# Patient Record
Sex: Male | Born: 1955 | Race: White | Hispanic: No | Marital: Married | Smoking: Former smoker
Health system: Southern US, Community
[De-identification: ages and names within clinical notes are randomized; demographics above are authoritative.]

## PROBLEM LIST (undated history)

## (undated) DIAGNOSIS — E7800 Pure hypercholesterolemia, unspecified: Secondary | ICD-10-CM

## (undated) DIAGNOSIS — F419 Anxiety disorder, unspecified: Secondary | ICD-10-CM

---

## 2002-07-02 ENCOUNTER — Ambulatory Visit (HOSPITAL_COMMUNITY): Admission: RE | Admit: 2002-07-02 | Discharge: 2002-07-02 | Payer: Self-pay | Admitting: Internal Medicine

## 2004-09-02 ENCOUNTER — Ambulatory Visit (HOSPITAL_COMMUNITY): Admission: RE | Admit: 2004-09-02 | Discharge: 2004-09-02 | Payer: Self-pay | Admitting: Gastroenterology

## 2010-07-06 ENCOUNTER — Ambulatory Visit
Admission: RE | Admit: 2010-07-06 | Discharge: 2010-07-06 | Disposition: A | Discharge status: Home / Self Care | Payer: BC Managed Care – PPO | Source: Ambulatory Visit | Attending: Internal Medicine | Admitting: Internal Medicine

## 2010-07-06 ENCOUNTER — Other Ambulatory Visit: Payer: Self-pay | Admitting: Internal Medicine

## 2010-07-06 DIAGNOSIS — R05 Cough: Secondary | ICD-10-CM

## 2014-07-29 ENCOUNTER — Other Ambulatory Visit: Payer: Self-pay | Admitting: Internal Medicine

## 2014-07-29 ENCOUNTER — Ambulatory Visit
Admission: RE | Admit: 2014-07-29 | Discharge: 2014-07-29 | Disposition: A | Discharge status: Home / Self Care | Payer: Self-pay | Source: Ambulatory Visit | Attending: Internal Medicine | Admitting: Internal Medicine

## 2014-07-29 DIAGNOSIS — R059 Cough, unspecified: Secondary | ICD-10-CM

## 2014-07-29 DIAGNOSIS — R05 Cough: Secondary | ICD-10-CM

## 2017-11-14 ENCOUNTER — Other Ambulatory Visit: Payer: Self-pay | Admitting: Internal Medicine

## 2017-11-14 ENCOUNTER — Ambulatory Visit
Admission: RE | Admit: 2017-11-14 | Discharge: 2017-11-14 | Disposition: A | Discharge status: Home / Self Care | Payer: Self-pay | Source: Ambulatory Visit | Attending: Internal Medicine | Admitting: Internal Medicine

## 2017-11-14 DIAGNOSIS — M25551 Pain in right hip: Secondary | ICD-10-CM

## 2017-11-14 DIAGNOSIS — M545 Low back pain: Secondary | ICD-10-CM

## 2020-02-04 ENCOUNTER — Other Ambulatory Visit: Payer: Self-pay | Admitting: *Deleted

## 2020-02-04 ENCOUNTER — Other Ambulatory Visit: Payer: Self-pay

## 2020-02-04 DIAGNOSIS — Z20822 Contact with and (suspected) exposure to covid-19: Secondary | ICD-10-CM

## 2020-02-06 LAB — SARS-COV-2, NAA 2 DAY TAT

## 2020-02-06 LAB — NOVEL CORONAVIRUS, NAA: SARS-CoV-2, NAA: DETECTED — AB

## 2020-02-06 LAB — SPECIMEN STATUS REPORT

## 2020-04-29 ENCOUNTER — Emergency Department (HOSPITAL_COMMUNITY): Payer: Medicare Other

## 2020-04-29 ENCOUNTER — Other Ambulatory Visit: Payer: Self-pay

## 2020-04-29 ENCOUNTER — Encounter (HOSPITAL_COMMUNITY): Payer: Self-pay | Admitting: Emergency Medicine

## 2020-04-29 DIAGNOSIS — R509 Fever, unspecified: Secondary | ICD-10-CM | POA: Insufficient documentation

## 2020-04-29 DIAGNOSIS — R0789 Other chest pain: Secondary | ICD-10-CM | POA: Diagnosis not present

## 2020-04-29 DIAGNOSIS — R053 Chronic cough: Secondary | ICD-10-CM | POA: Insufficient documentation

## 2020-04-29 DIAGNOSIS — Z87891 Personal history of nicotine dependence: Secondary | ICD-10-CM | POA: Insufficient documentation

## 2020-04-29 DIAGNOSIS — Z20822 Contact with and (suspected) exposure to covid-19: Secondary | ICD-10-CM | POA: Diagnosis not present

## 2020-04-29 DIAGNOSIS — R0602 Shortness of breath: Secondary | ICD-10-CM | POA: Insufficient documentation

## 2020-04-29 LAB — BASIC METABOLIC PANEL
Anion gap: 10 (ref 5–15)
BUN: 13 mg/dL (ref 8–23)
CO2: 23 mmol/L (ref 22–32)
Calcium: 9.2 mg/dL (ref 8.9–10.3)
Chloride: 103 mmol/L (ref 98–111)
Creatinine, Ser: 1.06 mg/dL (ref 0.61–1.24)
GFR, Estimated: >60 mL/min (ref >60)
Glucose, Bld: 112 mg/dL — ABNORMAL HIGH (ref 70–99)
Potassium: 3.6 mmol/L (ref 3.5–5.1)
Sodium: 136 mmol/L (ref 135–145)

## 2020-04-29 LAB — CBC
HCT: 48.3 % (ref 39.0–52.0)
Hemoglobin: 16.5 g/dL (ref 13.0–17.0)
MCH: 31.5 pg (ref 26.0–34.0)
MCHC: 34.2 g/dL (ref 30.0–36.0)
MCV: 92.4 fL (ref 80.0–100.0)
Platelets: 169 10*3/uL (ref 150–400)
RBC: 5.23 MIL/uL (ref 4.22–5.81)
RDW: 12.3 % (ref 11.5–15.5)
WBC: 6.2 10*3/uL (ref 4.0–10.5)
nRBC: 0 % (ref 0.0–0.2)

## 2020-04-29 LAB — RESP PANEL BY RT-PCR (FLU A&B, COVID) ARPGX2
Influenza A by PCR: NEGATIVE
Influenza B by PCR: NEGATIVE
SARS Coronavirus 2 by RT PCR: NEGATIVE

## 2020-04-29 LAB — TROPONIN I (HIGH SENSITIVITY): Troponin I (High Sensitivity): 3 ng/L (ref <18)

## 2020-04-29 MED ORDER — OXYCODONE-ACETAMINOPHEN 5-325 MG PO TABS
1.0000 | ORAL_TABLET | ORAL | Status: DC | PRN
  Administered 2020-04-29: 1 via ORAL
  Filled 2020-04-29: qty 1

## 2020-04-29 MED ORDER — ONDANSETRON 4 MG PO TBDP
4.0000 mg | ORAL_TABLET | Freq: Once | ORAL | Status: AC
  Administered 2020-04-29: 4 mg via ORAL
  Filled 2020-04-29: qty 1

## 2020-04-29 NOTE — ED Triage Notes (Signed)
Patient states he has body aches, sob, chest tightness,fever, and chills. Patient states this started last night.

## 2020-04-30 ENCOUNTER — Emergency Department (HOSPITAL_COMMUNITY)
Admission: EM | Admit: 2020-04-30 | Discharge: 2020-04-30 | Disposition: A | Discharge status: Home / Self Care | Payer: Medicare Other | Attending: Emergency Medicine | Admitting: Emergency Medicine

## 2020-04-30 DIAGNOSIS — R079 Chest pain, unspecified: Secondary | ICD-10-CM

## 2020-04-30 DIAGNOSIS — R509 Fever, unspecified: Secondary | ICD-10-CM | POA: Diagnosis not present

## 2020-04-30 HISTORY — DX: Anxiety disorder, unspecified: F41.9

## 2020-04-30 HISTORY — DX: Pure hypercholesterolemia, unspecified: E78.00

## 2020-04-30 LAB — TROPONIN I (HIGH SENSITIVITY): Troponin I (High Sensitivity): 7 ng/L (ref <18)

## 2020-04-30 NOTE — Discharge Instructions (Addendum)
Your measures of heart damage were both negative.  This makes this unlikely to be a heart attack.  Please follow-up with your family doctor.  Take tylenol 2 pills 4 times a day and motrin 4 pills 3 times a day.  Drink plenty of fluids.  Return for worsening shortness of breath, headache, confusion. Follow up with your family doctor.

## 2020-04-30 NOTE — ED Provider Notes (Signed)
[facility]-EMERGENCY DEPT Provider Note   CSN: [id number] Arrival date & time: 04/29/20  1930     History Chief Complaint  Patient presents with  . Chest Pain  . Shortness of Breath    Steve Mcpherson is a 64 y.o. male.  64 yo M with a chief complaints of fever chest pain shortness of breath.  Patient had a dental extraction and a cadaver bone grafting done yesterday.  Today he felt somewhat bad while he was at work and then came home.  Checked his temperature and was 104 temp orally.  The patient rested and then realized he was having some trouble breathing.  Felt some chest pressure with this as well.  Has had a sensation like this previously with anxiety though not completely similar.  Worse with exertion better with rest.  Having a mild cough with chronic and unchanged.  No rashes no abdominal pain no urinary symptoms.  Denies any significant pain in his mouth with procedure.  Denies pain in his neck.  Received a Percocet in triage while waiting to be seen and had complete resolution of his symptoms.  Denies history of MI, denies hypertension.  History of lipidemia denies diabetes remote smoker denies family history of MI.  Denies history of PE or DVT denies hemoptysis denies unilateral lower extremity edema denies recent surgery immobilization or hospitalization denies history of cancer.  The history is provided by the patient and the spouse.  Chest Pain Pain location:  Epigastric Pain quality: aching and pressure   Pain radiates to:  Does not radiate Pain severity:  Moderate Onset quality:  Gradual Duration:  6 hours Timing:  Rare Progression:  Resolved Chronicity:  New Relieved by:  Nothing Worsened by:  Nothing Ineffective treatments:  None tried Associated symptoms: shortness of breath   Associated symptoms: no abdominal pain, no fever, no headache, no palpitations and no vomiting   Shortness of Breath Associated symptoms: chest pain    Associated symptoms: no abdominal pain, no fever, no headaches, no rash and no vomiting        Past Medical History:  Diagnosis Date  . Anxiety   . High blood cholesterol     There are no problems to display for this patient.   History reviewed. No pertinent surgical history.     History reviewed. No pertinent family history.  Social History   Tobacco Use  . Smoking status: Former Games developer  . Smokeless tobacco: Never Used  Vaping Use  . Vaping Use: Never used  Substance Use Topics  . Alcohol use: Not Currently  . Drug use: Never    Home Medications Prior to Admission medications   Not on File    Allergies    Patient has no known allergies.  Review of Systems   Review of Systems  Constitutional: Negative for chills and fever.  HENT: Negative for congestion and facial swelling.   Eyes: Negative for discharge and visual disturbance.  Respiratory: Positive for shortness of breath.   Cardiovascular: Positive for chest pain. Negative for palpitations.  Gastrointestinal: Negative for abdominal pain, diarrhea and vomiting.  Musculoskeletal: Negative for arthralgias and myalgias.  Skin: Negative for color change and rash.  Neurological: Negative for tremors, syncope and headaches.  Psychiatric/Behavioral: Negative for confusion and dysphoric mood.    Physical Exam Updated Vital Signs BP 110/75 (BP Location: Left Arm)   Pulse 91   Temp 99.1 F (37.3 C)   Resp 16   Ht 5\' 11"  (1.803  m)   Wt 96.2 kg   SpO2 95%   BMI 29.57 kg/m   Physical Exam Vitals and nursing note reviewed.  Constitutional:      Appearance: He is well-developed and well-nourished.  HENT:     Head: Normocephalic and atraumatic.     Mouth/Throat:   Eyes:     Extraocular Movements: EOM normal.     Pupils: Pupils are equal, round, and reactive to light.  Neck:     Vascular: No JVD.  Cardiovascular:     Rate and Rhythm: Normal rate and regular rhythm.     Heart sounds: No murmur  heard. No friction rub. No gallop.   Pulmonary:     Effort: No respiratory distress.     Breath sounds: No wheezing.  Abdominal:     General: There is no distension.     Tenderness: There is no guarding or rebound.  Musculoskeletal:        General: Normal range of motion.     Cervical back: Normal range of motion and neck supple.  Skin:    Coloration: Skin is not pale.     Findings: No rash.  Neurological:     Mental Status: He is alert and oriented to person, place, and time.  Psychiatric:        Mood and Affect: Mood and affect normal.        Behavior: Behavior normal.     ED Results / Procedures / Treatments   Labs (all labs ordered are listed, but only abnormal results are displayed) Labs Reviewed  BASIC METABOLIC PANEL - Abnormal; Notable for the following components:      Result Value   Glucose, Bld 112 (*)    All other components within normal limits  RESP PANEL BY RT-PCR (FLU A&B, COVID) ARPGX2  CULTURE, BLOOD (ROUTINE X 2)  CULTURE, BLOOD (ROUTINE X 2)  CBC  URINALYSIS, ROUTINE W REFLEX MICROSCOPIC  TROPONIN I (HIGH SENSITIVITY)  TROPONIN I (HIGH SENSITIVITY)    EKG EKG Interpretation  Date/Time:  Wednesday April 29 2020 19:41:06 EST Ventricular Rate:  107 PR Interval:    QRS Duration: 104 QT Interval:  339 QTC Calculation: 453 R Axis:   -86 Text Interpretation: Sinus tachycardia Atrial premature complex Probable right ventricular hypertrophy Inferior infarct, old Baseline wander in lead(s) II 12 Lead; Mason-Likar No old tracing to compare Confirmed by Melene Plan [phone]) on 04/30/2020 1:06:51 AM   Radiology DG Chest 2 View  Result Date: 04/29/2020 CLINICAL DATA:  Chest pain, chest tightness, shortness of breath EXAM: CHEST - 2 VIEW COMPARISON:  Chest x-ray 07/29/2014., Chest x-ray 07/06/2010 FINDINGS: The heart size and mediastinal contours are within normal limits. Aortic arch calcification. Posterior basilar scarring. No focal consolidation. No  pulmonary edema. No pleural effusion. No pneumothorax. No acute osseous abnormality. IMPRESSION: No active cardiopulmonary disease. Electronically Signed   By: Tish Frederickson M.D.   On: 04/29/2020 20:11    Procedures Procedures (including critical care time)  Medications Ordered in ED Medications  oxyCODONE-acetaminophen (PERCOCET/ROXICET) 5-325 MG per tablet 1 tablet (1 tablet Oral Given 04/29/20 2037)  ondansetron (ZOFRAN-ODT) disintegrating tablet 4 mg (4 mg Oral Given 04/29/20 2037)    ED Course  I have reviewed the triage vital signs and the nursing notes.  Pertinent labs & imaging results that were available during my care of the patient were reviewed by me and considered in my medical decision making (see chart for details).    MDM Rules/Calculators/A&P  64 yo M with a chief complaints of a fever and shortness of breath.  This resolved completely with Percocet.  He had a oral surgery done yesterday.  No murmurs on my exam.  Well-appearing and nontoxic.  Clear lung sounds for me.  Chest x-ray viewed by me without focal infiltrate.  Covid test negative.  Initial troponin negative.  No significant anemia.  No significant electrolyte abnormality.  With the reported high fever at home we will send blood cultures.  His oral surgery area looks well, no erythema no fluctuance no drainage.  I discussed possible observation in the hospital which the patient is declining.  We will get a second troponin.  If negative will discharge home.  Delta negative.  Continues to feel well.  PCP follow up.   3:47 AM:  I have discussed the diagnosis/risks/treatment options with the patient and believe the pt to be eligible for discharge home to follow-up with PCP. We also discussed returning to the ED immediately if new or worsening sx occur. We discussed the sx which are most concerning (e.g., sudden worsening pain, fever, inability to tolerate by mouth) that necessitate immediate  return. Medications administered to the patient during their visit and any new prescriptions provided to the patient are listed below.  Medications given during this visit Medications  oxyCODONE-acetaminophen (PERCOCET/ROXICET) 5-325 MG per tablet 1 tablet (1 tablet Oral Given 04/29/20 2037)  ondansetron (ZOFRAN-ODT) disintegrating tablet 4 mg (4 mg Oral Given 04/29/20 2037)     The patient appears reasonably screen and/or stabilized for discharge and I doubt any other medical condition or other [facility] requiring further screening, evaluation, or treatment in the ED at this time prior to discharge.   Final Clinical Impression(s) / ED Diagnoses Final diagnoses:  Fever in adult  Nonspecific chest pain    Rx / DC Orders ED Discharge Orders    None       Melene Plan, DO 04/30/20 [phone]

## 2020-05-05 LAB — CULTURE, BLOOD (ROUTINE X 2)
Culture: NO GROWTH
Special Requests: ADEQUATE

## 2021-03-17 ENCOUNTER — Other Ambulatory Visit: Payer: Self-pay | Admitting: Internal Medicine

## 2021-03-17 ENCOUNTER — Ambulatory Visit
Admission: RE | Admit: 2021-03-17 | Discharge: 2021-03-17 | Disposition: A | Discharge status: Home / Self Care | Payer: Medicare Other | Source: Ambulatory Visit | Attending: Internal Medicine | Admitting: Internal Medicine

## 2021-03-17 DIAGNOSIS — R059 Cough, unspecified: Secondary | ICD-10-CM

## 2022-08-09 ENCOUNTER — Emergency Department (HOSPITAL_COMMUNITY): Payer: Medicare Other

## 2022-08-09 ENCOUNTER — Encounter (HOSPITAL_COMMUNITY): Payer: Self-pay

## 2022-08-09 ENCOUNTER — Emergency Department (HOSPITAL_COMMUNITY)
Admission: EM | Admit: 2022-08-09 | Discharge: 2022-08-09 | Disposition: A | Discharge status: Home / Self Care | Payer: Medicare Other | Attending: Emergency Medicine | Admitting: Emergency Medicine

## 2022-08-09 DIAGNOSIS — S0181XA Laceration without foreign body of other part of head, initial encounter: Secondary | ICD-10-CM | POA: Insufficient documentation

## 2022-08-09 DIAGNOSIS — W06XXXA Fall from bed, initial encounter: Secondary | ICD-10-CM | POA: Insufficient documentation

## 2022-08-09 DIAGNOSIS — M25511 Pain in right shoulder: Secondary | ICD-10-CM | POA: Insufficient documentation

## 2022-08-09 MED ORDER — LIDOCAINE-EPINEPHRINE-TETRACAINE (LET) TOPICAL GEL
3.0000 mL | Freq: Once | TOPICAL | Status: AC
  Administered 2022-08-09: 3 mL via TOPICAL
  Filled 2022-08-09: qty 3

## 2022-08-09 MED ORDER — LIDOCAINE-EPINEPHRINE (PF) 2 %-1:200000 IJ SOLN
10.0000 mL | Freq: Once | INTRAMUSCULAR | Status: AC
  Administered 2022-08-09: 10 mL
  Filled 2022-08-09: qty 20

## 2022-08-09 MED ORDER — BACITRACIN ZINC 500 UNIT/GM EX OINT
TOPICAL_OINTMENT | Freq: Every day | CUTANEOUS | Status: DC
  Filled 2022-08-09: qty 0.9

## 2022-08-09 NOTE — ED Provider Notes (Signed)
[place] EMERGENCY DEPARTMENT AT [facility] Provider Note   CSN: [id number] Arrival date & time: 08/09/22  0501     History  Chief Complaint  Patient presents with   Head Laceration    Steve Mcpherson is a 67 y.o. male.  67 year old male presents with laceration to left side forehead and pain in the right shoulder blade area.  Patient was asleep in bed when he fell out of bed for unknown reasons, striking his head on the bookcase next to the bed.  No loss of consciousness, is not anticoagulated.  Denies significant head pain, vomiting, confusion or repetitive questioning.  Primary complaint is pain in his right posterior shoulder which she feels is just generally sore.  Believes last tetanus to be within the past 5 years.  No other injuries, complaints, concerns.  Is not anticoagulated.       Home Medications Prior to Admission medications   Not on File      Allergies    Patient has no known allergies.    Review of Systems   Review of Systems Negative except as per HPI Physical Exam Updated Vital Signs BP (!) 153/89   Pulse 66   Temp 97.9 F (36.6 C) (Oral)   Resp 17   SpO2 97%  Physical Exam Vitals and nursing note reviewed.  Constitutional:      General: He is not in acute distress.    Appearance: He is well-developed. He is not diaphoretic.  HENT:     Head: Normocephalic.      Nose: Nose normal.     Mouth/Throat:     Mouth: Mucous membranes are moist.  Eyes:     Extraocular Movements: Extraocular movements intact.     Pupils: Pupils are equal, round, and reactive to light.  Pulmonary:     Effort: Pulmonary effort is normal.  Musculoskeletal:     Right shoulder: Normal.     Left shoulder: Normal.     Cervical back: Normal range of motion and neck supple. No tenderness.  Skin:    General: Skin is warm and dry.     Findings: No erythema or rash.  Neurological:     Mental Status: He is alert and oriented to person, place, and time.   Psychiatric:        Behavior: Behavior normal.     ED Results / Procedures / Treatments   Labs (all labs ordered are listed, but only abnormal results are displayed) Labs Reviewed - No data to display  EKG None  Radiology CT Head Wo Contrast  Result Date: 08/09/2022 CLINICAL DATA:  67 year old male status post fall from bed. Forehead laceration. EXAM: CT HEAD WITHOUT CONTRAST TECHNIQUE: Contiguous axial images were obtained from the base of the skull through the vertex without intravenous contrast. RADIATION DOSE REDUCTION: This exam was performed according to the departmental dose-optimization program which includes automated exposure control, adjustment of the mA and/or kV according to patient size and/or use of iterative reconstruction technique. COMPARISON:  None Available. FINDINGS: Brain: Cerebral volume is within normal limits for age. Scaphocephaly, normal variant. No midline shift, ventriculomegaly, mass effect, evidence of mass lesion, intracranial hemorrhage or evidence of cortically based acute infarction. Gray-white matter differentiation is within normal limits for age. No encephalomalacia identified. Vascular: Calcified atherosclerosis at the skull base. No suspicious intracranial vascular hyperdensity. Skull: No fracture identified. Sinuses/Orbits: Visualized paranasal sinuses and mastoids are clear. Other: Left forehead dressing material in place on series 3, image  33. Mild if any underlying scalp hematoma. No scalp soft tissue gas identified. Other visible orbit and scalp soft tissues are within normal limits. IMPRESSION: 1. Left forehead soft tissue injury without underlying skull fracture. 2. Normal for age non contrast CT appearance of the brain. Electronically Signed   By: Genevie Ann M.D.   On: 08/09/2022 06:21   DG Shoulder Right  Result Date: 08/09/2022 CLINICAL DATA:  Fall injury with right shoulder pain. EXAM: RIGHT SHOULDER - 2+ VIEW COMPARISON:  None Available.  FINDINGS: There is normal bone mineralization without evidence of fracture or dislocation. Narrowing and mild spurring are noted of the [facility] joint. The glenohumeral joint and acromiohumeral space are unremarkable. No displaced fracture is seen of the visualized right ribs. IMPRESSION: No acute fracture or dislocation. AC joint arthropathy. Electronically Signed   By: Telford Nab M.D.   On: 08/09/2022 06:07    Procedures .Marland KitchenLaceration Repair  Date/Time: 08/09/2022 5:43 AM  Performed by: Tacy Learn, PA-C Authorized by: Tacy Learn, PA-C   Consent:    Consent obtained:  Verbal   Consent given by:  Patient   Risks discussed:  Infection, pain, poor cosmetic result and poor wound healing   Alternatives discussed:  No treatment Universal protocol:    Patient identity confirmed:  Verbally with patient Anesthesia:    Anesthesia method:  Local infiltration and topical application   Topical anesthetic:  LET   Local anesthetic:  Lidocaine 2% WITH epi Laceration details:    Location:  Face   Face location:  Forehead   Length (cm):  3.5   Depth (mm):  5 Pre-procedure details:    Preparation:  Patient was prepped and draped in usual sterile fashion and imaging obtained to evaluate for foreign bodies Exploration:    Imaging obtained comment:  CT   Imaging outcome: foreign body not noted     Wound exploration: wound explored through full range of motion and entire depth of wound visualized     Wound extent: no foreign body and no underlying fracture     Contaminated: no   Treatment:    Area cleansed with:  Saline   Amount of cleaning:  Standard   Irrigation solution:  Sterile saline Skin repair:    Repair method:  Sutures   Suture size:  5-0   Suture material:  Prolene   Suture technique:  Simple interrupted   Number of sutures:  7 Approximation:    Approximation:  Close Repair type:    Repair type:  Simple Post-procedure details:    Dressing:  Open (no dressing)   Procedure  completion:  Tolerated well, no immediate complications     Medications Ordered in ED Medications  lidocaine-EPINEPHrine-tetracaine (LET) topical gel (3 mLs Topical Given 08/09/22 0535)  lidocaine-EPINEPHrine (XYLOCAINE W/EPI) 2 %-1:200000 (PF) injection 10 mL (10 mLs Infiltration Given 08/09/22 0535)    ED Course/ Medical Decision Making/ A&P                             Medical Decision Making Amount and/or Complexity of Data Reviewed Radiology: ordered.  Risk Prescription drug management.   This patient presents to the ED for concern of fall from bed with left forehead laceration and right shoulder pain, this involves an extensive number of treatment options, and is a complaint that carries with it a high risk of complications and morbidity.  The differential diagnosis includes but not limited to intracranial injury,  fracture   Co morbidities that complicate the patient evaluation  Hyperlipidemia, anxiety   Additional history obtained:  Additional history obtained from spouse at bedside who contributes to history as above External records from outside source obtained and reviewed including immunization record, no documentation of Tdap though patient feels confident this is up-to-date   Imaging Studies ordered:  I ordered imaging studies including x-ray right shoulder, CT head I independently visualized and interpreted imaging which showed x-ray shoulder negative for acute fracture or dislocation.  CT head negative for acute intracranial normality I agree with the radiologist interpretation   Problem List / ED Course / Critical interventions / Medication management  67 year old male presents with laceration to left forehead and right shoulder pain as above.  Right shoulder range of motion normal, no deformity, swelling, ecchymosis, crepitus or palpation producing tenderness.  Found to have a laceration to the left forehead, neuroexam unremarkable.  Imaging unremarkable and  reassuring.  Wound irrigated and closed.  Recommend wound recheck with PCP in 2 days, suture removal in 5 days. I ordered medication including let, lidocaine for anesthetic for wound closure face Reevaluation of the patient after these medicines showed that the patient improved I have reviewed the patients home medicines and have made adjustments as needed   Social Determinants of Health:  Lives with spouse   Test / Admission - Considered:  Dc to recheck with PCP. Return to ER for worsening or concerning symptoms.          Final Clinical Impression(s) / ED Diagnoses Final diagnoses:  Facial laceration, initial encounter  Fall from bed, initial encounter  Acute pain of right shoulder    Rx / DC Orders ED Discharge Orders     None         Tacy Learn, PA-C 08/09/22 [id number]    Regan Lemming, MD 08/09/22 [phone]

## 2022-08-09 NOTE — Discharge Instructions (Addendum)
Keep wound and and dry.  Recheck with PCP in 2 days, suture removal in 5 days.  Return to ER for worsening or concerning symptoms.

## 2022-08-09 NOTE — ED Triage Notes (Signed)
Patient arrived after falling out of his bed causing him pain behind his right shoulder and a laceration to his forehead. Bleeding controlled on arrival. Declines taking blood thinners, no LOC.

## 2023-03-05 ENCOUNTER — Encounter (HOSPITAL_BASED_OUTPATIENT_CLINIC_OR_DEPARTMENT_OTHER): Payer: Self-pay | Admitting: Emergency Medicine

## 2023-03-05 ENCOUNTER — Emergency Department (HOSPITAL_BASED_OUTPATIENT_CLINIC_OR_DEPARTMENT_OTHER)
Admission: EM | Admit: 2023-03-05 | Discharge: 2023-03-05 | Disposition: A | Discharge status: Home / Self Care | Payer: Medicare Other | Attending: Emergency Medicine | Admitting: Emergency Medicine

## 2023-03-05 ENCOUNTER — Other Ambulatory Visit: Payer: Self-pay

## 2023-03-05 DIAGNOSIS — W228XXA Striking against or struck by other objects, initial encounter: Secondary | ICD-10-CM | POA: Insufficient documentation

## 2023-03-05 DIAGNOSIS — Z87891 Personal history of nicotine dependence: Secondary | ICD-10-CM | POA: Insufficient documentation

## 2023-03-05 DIAGNOSIS — S0592XA Unspecified injury of left eye and orbit, initial encounter: Secondary | ICD-10-CM | POA: Insufficient documentation

## 2023-03-05 MED ORDER — FLUORESCEIN SODIUM 1 MG OP STRP
1.0000 | ORAL_STRIP | Freq: Once | OPHTHALMIC | Status: AC
  Administered 2023-03-05: 1 via OPHTHALMIC
  Filled 2023-03-05: qty 1

## 2023-03-05 MED ORDER — TETRACAINE HCL 0.5 % OP SOLN
2.0000 [drp] | Freq: Once | OPHTHALMIC | Status: AC
  Administered 2023-03-05: 2 [drp] via OPHTHALMIC
  Filled 2023-03-05: qty 4

## 2023-03-05 MED ORDER — ERYTHROMYCIN 5 MG/GM OP OINT: TOPICAL_OINTMENT | OPHTHALMIC | 0 refills | Status: AC

## 2023-03-05 NOTE — ED Triage Notes (Signed)
Pt was folding a tarp and it hit his left eye. No pain. No vision change

## 2023-03-05 NOTE — ED Notes (Signed)
Discharge paperwork given and verbally understood. 

## 2023-03-05 NOTE — ED Provider Notes (Signed)
[place] EMERGENCY DEPARTMENT AT [facility] Provider Note   CSN: [id number] Arrival date & time: 03/05/23  1213     History  Chief Complaint  Patient presents with   Eye Injury    Steve Mcpherson is a 67 y.o. male.   Eye Injury   67 year old male presents emergency department with complaints of eye injury.  Patient states that he was pulling a tarp earlier today when it scratched his left eye.  Patient states that he noticed bleeding in the white part of his left eye as well as some blood coming from his eye.  States that since then, bleeding has stopped.  Reports reading glasses use at baseline but denies any contact lens use or glasses use regularly.  States that his vision has been at baseline since incident.  Denies any eye pain.  Does report mild foreign body sensation where he feels like the area was scratched.  Past medical history significant for anxiety, hypercholesterolemia  Home Medications Prior to Admission medications   Not on File      Allergies    Patient has no known allergies.    Review of Systems   Review of Systems  All other systems reviewed and are negative.   Physical Exam Updated Vital Signs BP (!) 142/73 (BP Location: Right Arm)   Pulse 65   Temp 97.9 F (36.6 C) (Oral)   Resp 16   SpO2 96%  Physical Exam Vitals and nursing note reviewed.  Constitutional:      General: He is not in acute distress.    Appearance: He is well-developed.  HENT:     Head: Normocephalic and atraumatic.  Eyes:     Intraocular pressure: Left eye pressure is 17 mmHg. Measurements were taken using a handheld tonometer.    Conjunctiva/sclera: Conjunctivae normal.      Comments: Patient with subconjunctival hemorrhage appreciated on the lateral aspect of left eye.  PERRLA bilaterally.  No pain with EOMs.  Eye not proptotic in appearance.  Fluorescein exam did show uptake in areas above.  Seidel sign negative.  No obvious foreign body with lid  eversion.  No obvious ulceration.  Cardiovascular:     Rate and Rhythm: Normal rate and regular rhythm.  Pulmonary:     Effort: Pulmonary effort is normal. No respiratory distress.     Breath sounds: Normal breath sounds.  Abdominal:     Palpations: Abdomen is soft.     Tenderness: There is no abdominal tenderness.  Musculoskeletal:        General: No swelling.     Cervical back: Neck supple.  Skin:    General: Skin is warm and dry.     Capillary Refill: Capillary refill takes less than 2 seconds.  Neurological:     Mental Status: He is alert.  Psychiatric:        Mood and Affect: Mood normal.     ED Results / Procedures / Treatments   Labs (all labs ordered are listed, but only abnormal results are displayed) Labs Reviewed - No data to display  EKG None  Radiology No results found.  Procedures Procedures    Medications Ordered in ED Medications  tetracaine (PONTOCAINE) 0.5 % ophthalmic solution 2 drop (has no administration in time range)  fluorescein ophthalmic strip 1 strip (has no administration in time range)    ED Course/ Medical Decision Making/ A&P  Medical Decision Making Risk Prescription drug management.   This patient presents to the ED for concern of eye injury, this involves an extensive number of treatment options, and is a complaint that carries with it a high risk of complications and morbidity.  The differential diagnosis includes global rupture/penetration, corneal abrasion, corneal ulceration, retrobulbar hematoma, other   Co morbidities that complicate the patient evaluation  See HPI   Additional history obtained:  Additional history obtained from EMR External records from outside source obtained and reviewed including hospital records   Lab Tests:  N/a   Imaging Studies ordered:  N/a   Cardiac Monitoring: / EKG:  The patient was maintained on a cardiac monitor.  I personally viewed and  interpreted the cardiac monitored which showed an underlying rhythm of: Sinus rhythm   Consultations Obtained:  N/a   Problem List / ED Course / Critical interventions / Medication management  Eye injury I ordered medication including tetracaine, fluorescein   Reevaluation of the patient after these medicines showed that the patient improved I have reviewed the patients home medicines and have made adjustments as needed   Social Determinants of Health:  Former cigarette use.  Denies illicit drug use.   Test / Admission - Considered:  Eye injury Vitals signs within normal range and stable throughout visit. 67 year old male presents emergency department with complaints of left eye injury.  Patient was folding up a tarp earlier and scratched his left eye.  On exam, patient with evidence of subconjunctival hemorrhage as well as with abrasion appreciated as above.  No obvious foreign body.  No obvious signs clinically of global penetration/perforation.  Patient without proptotic eye, pain with EOMs; low suspicion for retrobulbar hematoma.  Patient's visual acuity at baseline.  Will treat with topical antibiotics, Tylenol/Motrin for any development of pain and recommend follow-up with eye specialist in the outpatient setting.  Treatment plan discussed at length with patient and he acknowledged understanding was agreeable to said plan.  Patient overall well-appearing, afebrile in no acute distress. Worrisome signs and symptoms were discussed with the patient, and the patient acknowledged understanding to return to the ED if noticed. Patient was stable upon discharge.        Final Clinical Impression(s) / ED Diagnoses Final diagnoses:  None    Rx / DC Orders ED Discharge Orders     None         Peter Garter, [place] 03/05/23 1308    Laurence Spates, MD 03/08/23 [phone]

## 2023-03-05 NOTE — Discharge Instructions (Addendum)
As discussed, you do have a scratch on the white part of your eye which is causing your symptoms.  There is no evidence of global penetration from the injury.  We will treat this with antibiotic eye ointment to prevent infection.  The blood will resolve with time.  Recommend follow-up with eye specialist for reassessment of your symptoms.  Please do not hesitate to return to emergency department for worrisome signs and symptoms we discussed become apparent.

## 2023-03-24 IMAGING — CR DG CHEST 2V
2 series · 2 of 2 positions shown · non-contrast
Comparison: Chest x-ray 04/29/2020

CLINICAL DATA: Cough.

EXAM:
CHEST - 2 VIEW

[w chest pa]
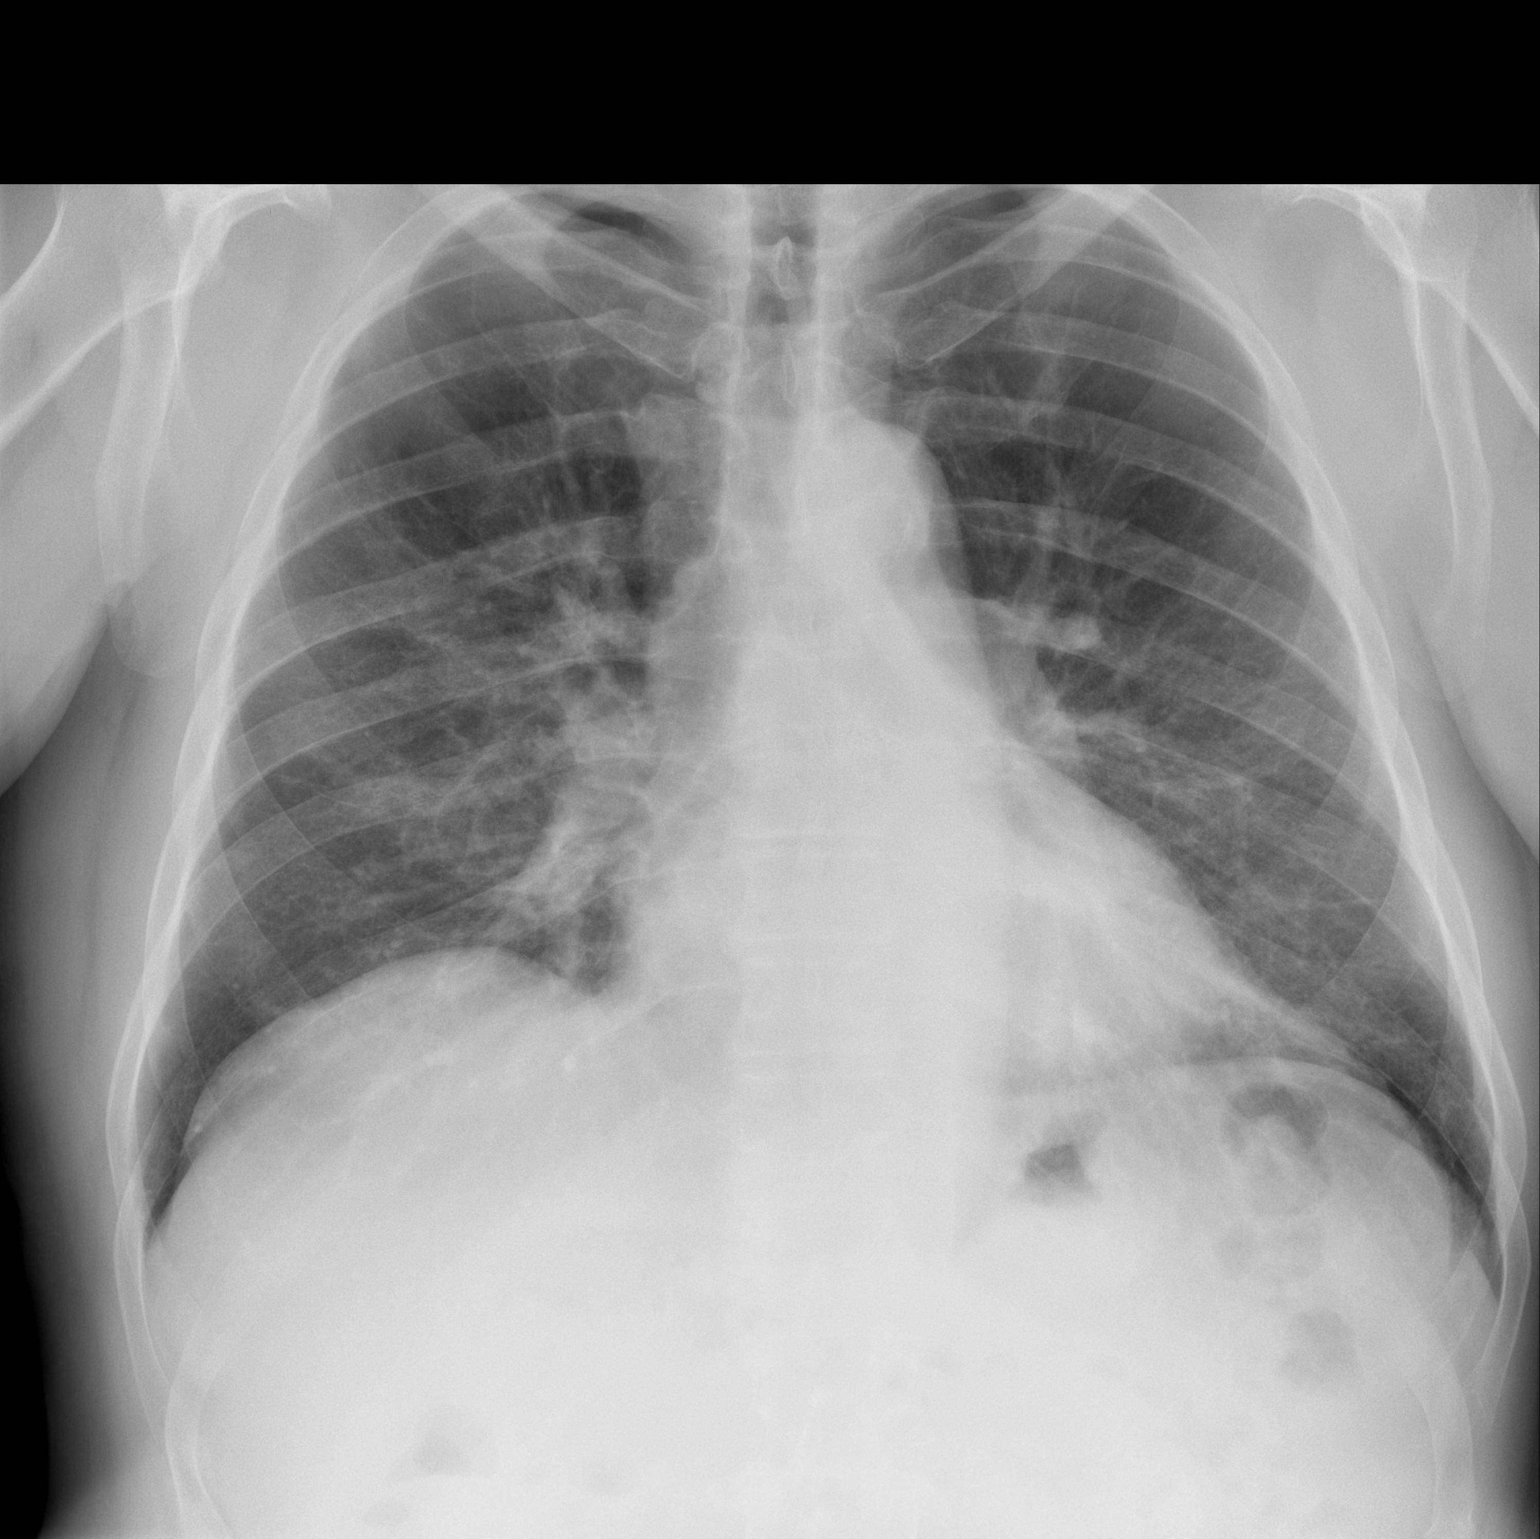

[w chest lat]
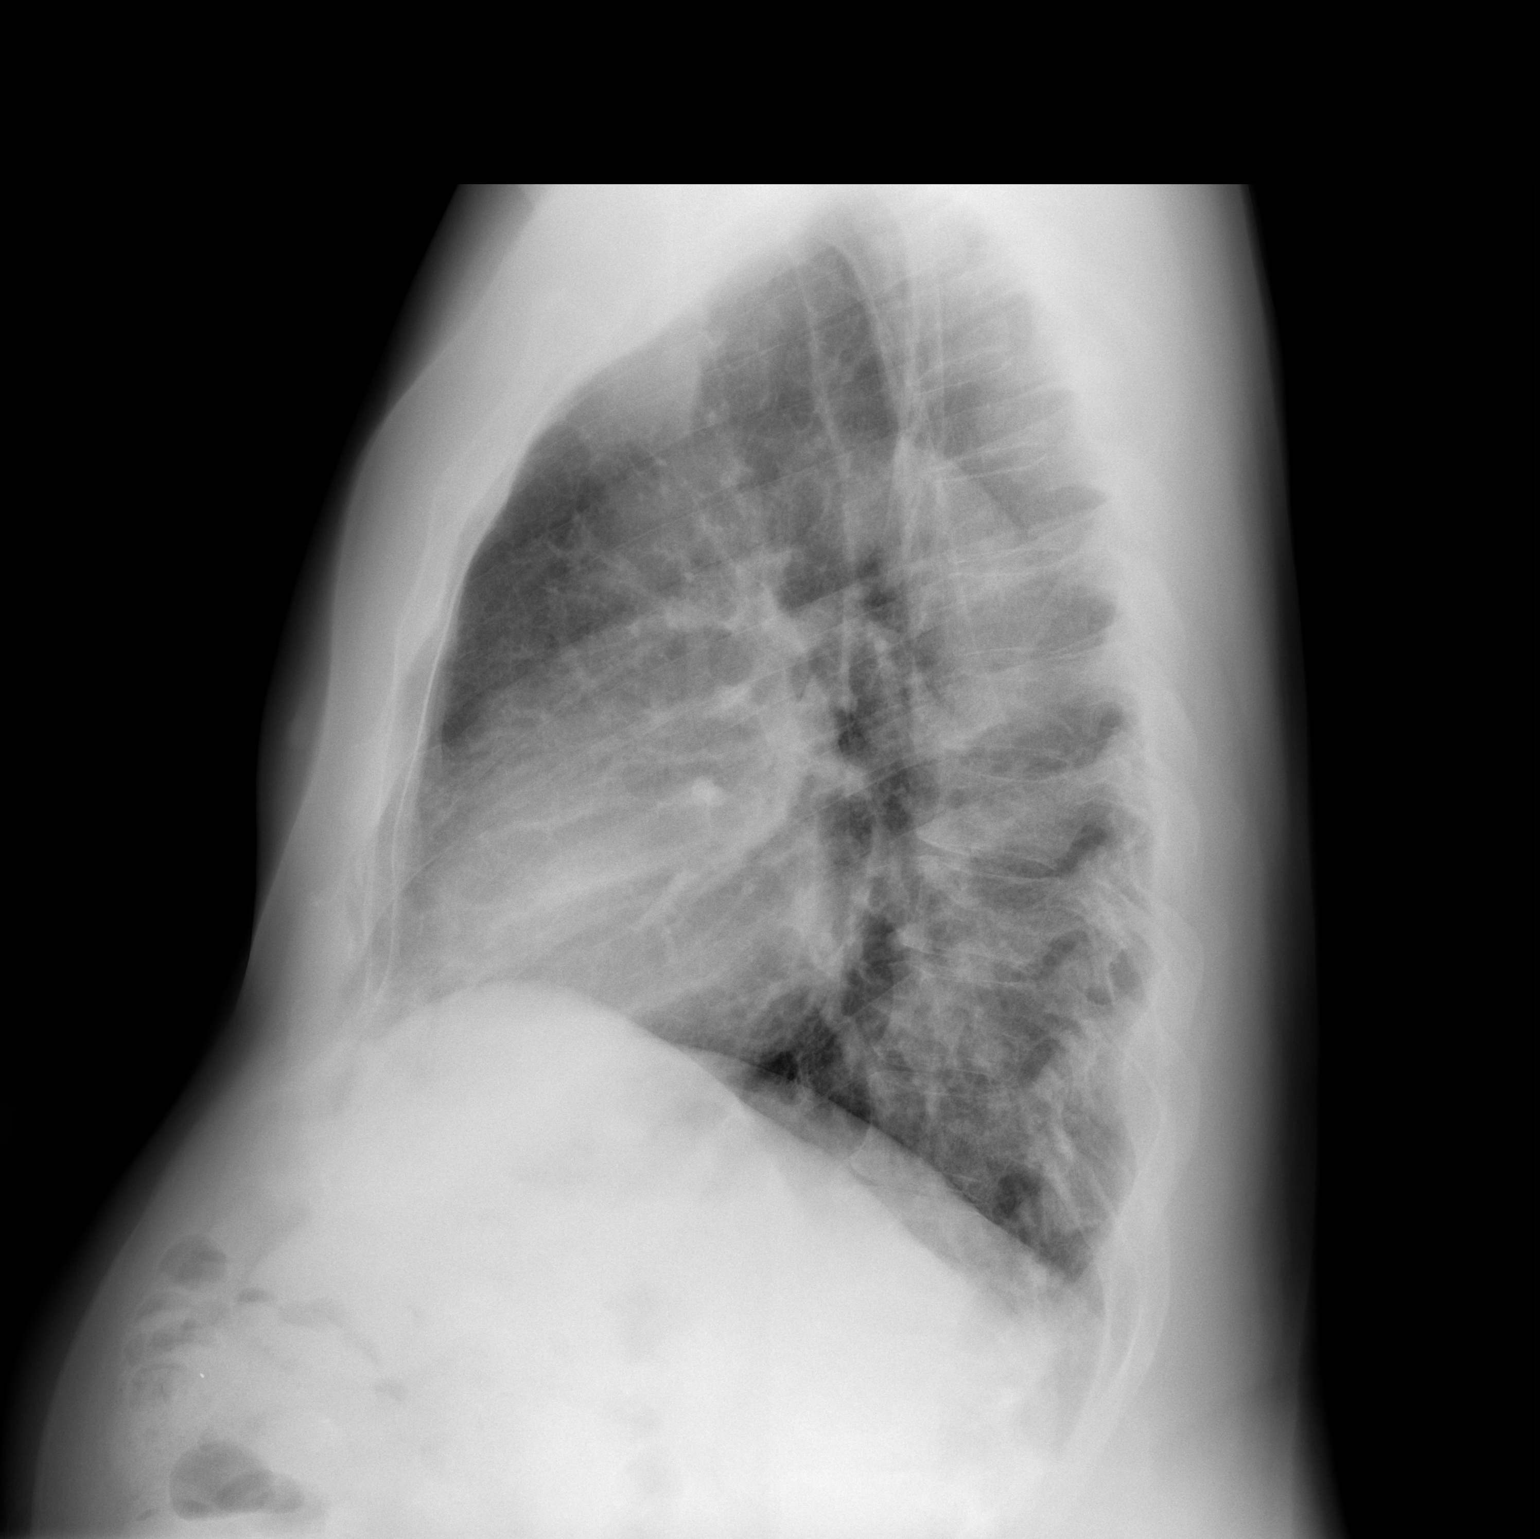

[2 of 2 positions shown; findings below may reference images not displayed]

FINDINGS: Heart size and mediastinal contours are within normal limits. No
suspicious pulmonary opacities identified.

No pleural effusion or pneumothorax visualized.

No acute osseous abnormality appreciated.
IMPRESSION: No acute intrathoracic process identified.
